# Patient Record
Sex: Female | Born: 1950 | Race: White | Hispanic: No | Marital: Married | Smoking: Former smoker
Health system: Southern US, Community
[De-identification: ages and names within clinical notes are randomized; demographics above are authoritative.]

## PROBLEM LIST (undated history)

## (undated) DIAGNOSIS — M1990 Unspecified osteoarthritis, unspecified site: Secondary | ICD-10-CM

## (undated) DIAGNOSIS — L409 Psoriasis, unspecified: Secondary | ICD-10-CM

## (undated) DIAGNOSIS — E785 Hyperlipidemia, unspecified: Secondary | ICD-10-CM

## (undated) DIAGNOSIS — Z8719 Personal history of other diseases of the digestive system: Secondary | ICD-10-CM

## (undated) DIAGNOSIS — K219 Gastro-esophageal reflux disease without esophagitis: Secondary | ICD-10-CM

## (undated) DIAGNOSIS — K200 Eosinophilic esophagitis: Secondary | ICD-10-CM

## (undated) DIAGNOSIS — K8020 Calculus of gallbladder without cholecystitis without obstruction: Secondary | ICD-10-CM

## (undated) DIAGNOSIS — I10 Essential (primary) hypertension: Secondary | ICD-10-CM

## (undated) DIAGNOSIS — E039 Hypothyroidism, unspecified: Secondary | ICD-10-CM

## (undated) DIAGNOSIS — K859 Acute pancreatitis without necrosis or infection, unspecified: Secondary | ICD-10-CM

## (undated) DIAGNOSIS — K635 Polyp of colon: Secondary | ICD-10-CM

## (undated) HISTORY — DX: Psoriasis, unspecified: L40.9

## (undated) HISTORY — DX: Hyperlipidemia, unspecified: E78.5

## (undated) HISTORY — PX: OTHER SURGICAL HISTORY: SHX169

## (undated) HISTORY — DX: Gastro-esophageal reflux disease without esophagitis: K21.9

## (undated) HISTORY — DX: Personal history of other diseases of the digestive system: Z87.19

## (undated) HISTORY — DX: Essential (primary) hypertension: I10

## (undated) HISTORY — DX: Unspecified osteoarthritis, unspecified site: M19.90

## (undated) HISTORY — DX: Calculus of gallbladder without cholecystitis without obstruction: K80.20

## (undated) HISTORY — PX: APPENDECTOMY: SHX54

## (undated) HISTORY — DX: Polyp of colon: K63.5

## (undated) HISTORY — DX: Acute pancreatitis without necrosis or infection, unspecified: K85.90

## (undated) HISTORY — PX: COLOSTOMY: SHX63

## (undated) HISTORY — PX: CHOLECYSTECTOMY: SHX55

## (undated) HISTORY — DX: Hypothyroidism, unspecified: E03.9

## (undated) HISTORY — DX: Eosinophilic esophagitis: K20.0

---

## 2019-11-27 ENCOUNTER — Other Ambulatory Visit (INDEPENDENT_AMBULATORY_CARE_PROVIDER_SITE_OTHER): Payer: Medicare Other

## 2019-11-27 ENCOUNTER — Encounter: Payer: Self-pay | Admitting: Nurse Practitioner

## 2019-11-27 ENCOUNTER — Ambulatory Visit: Payer: Medicare Other | Admitting: Nurse Practitioner

## 2019-11-27 VITALS — BP 150/80 | HR 64 | Ht 65.25 in | Wt 168.1 lb

## 2019-11-27 DIAGNOSIS — Z8719 Personal history of other diseases of the digestive system: Secondary | ICD-10-CM

## 2019-11-27 DIAGNOSIS — Z86010 Personal history of colonic polyps: Secondary | ICD-10-CM

## 2019-11-27 DIAGNOSIS — K200 Eosinophilic esophagitis: Secondary | ICD-10-CM

## 2019-11-27 LAB — BASIC METABOLIC PANEL
BUN: 14 mg/dL (ref 6–23)
CO2: 30 mEq/L (ref 19–32)
Calcium: 9.7 mg/dL (ref 8.4–10.5)
Chloride: 103 mEq/L (ref 96–112)
Creatinine, Ser: 0.76 mg/dL (ref 0.40–1.20)
GFR: 75.15 mL/min (ref >60.00)
Glucose, Bld: 95 mg/dL (ref 70–99)
Potassium: 4.4 mEq/L (ref 3.5–5.1)
Sodium: 142 mEq/L (ref 135–145)

## 2019-11-27 NOTE — Patient Instructions (Addendum)
If you are age 70 or older, your body mass index should be between 23-30. Your Body mass index is 27.75 kg/m. If this is out of the aforementioned range listed, please consider follow up with your Primary Care Provider.  If you are age 73 or younger, your body mass index should be between 19-25. Your Body mass index is 27.75 kg/m. If this is out of the aformentioned range listed, please consider follow up with your Primary Care Provider.   Your provider has requested that you go to the basement level for lab work before leaving today. Press "B" on the elevator. The lab is located at the first door on the left as you exit the elevator.  You have been scheduled for a CT scan of the abdomen and pelvis at [facility] ([address]---this is in the same building as [employer]).   You are scheduled on Monday 12/03/19 at 11 am. You should arrive 15 minutes prior to your appointment time for registration. Please follow the written instructions below on the day of your exam:  WARNING: IF YOU ARE ALLERGIC TO IODINE/X-RAY DYE, PLEASE NOTIFY RADIOLOGY IMMEDIATELY AT [phone]! YOU WILL BE GIVEN A 13 HOUR PREMEDICATION PREP.  1) Do not eat or drink anything after 7 am (4 hours prior to your test) 2) You have been given 2 bottles of oral contrast to drink. The solution may taste better if refrigerated, but do NOT add ice or any other liquid to this solution. Shake well before drinking.    Drink 1 bottle of contrast @ 9 am (2 hours prior to your exam)  Drink 1 bottle of contrast @ 10 am (1 hour prior to your exam)  You may take any medications as prescribed with a small amount of water, if necessary. If you take any of the following medications: METFORMIN, GLUCOPHAGE, GLUCOVANCE, AVANDAMET, RIOMET, FORTAMET, [place] MET, JANUMET, GLUMETZA or METAGLIP, you MAY be asked to HOLD this medication 48 hours AFTER the exam.  The purpose of you drinking the oral contrast is to aid in  the visualization of your intestinal tract. The contrast solution may cause some diarrhea. Depending on your individual set of symptoms, you may also receive an intravenous injection of x-ray contrast/dye. Plan on being at [facility] for 30 minutes or longer, depending on the type of exam you are having performed.  This test typically takes 30-45 minutes to complete.  If you have any questions regarding your exam or if you need to reschedule, you may call the CT department at [phone] between the hours of 8:00 am and 5:00 pm, Monday-Friday.  ____________________________________________________________

## 2019-11-27 NOTE — Progress Notes (Signed)
Assessment and plan reviewed with GI nurse practitioner

## 2019-11-27 NOTE — Progress Notes (Signed)
ASSESSMENT / PLAN:   69 year old female with PMH significant for hypothyroidism, hyperlipidemia, hypertension, psoriatic arthritis, colon polyps, pancreatitis, eosinophilic esophagitis, remote cholecystectomy, appendectomy  # Acute pancreatitis, resolved  -- Acute (very transient) upper abdominal pain in the setting of lipase of 979 , nearly 3 times the ULN ( ref [id number]).  AST and ALT normal ( remainder of LFTs not reported).  --Symptoms occurred prior to relocating to [place] a few weeks ago.  Limited labs available on patient's my chart record retrieved by her cell phone today. Follow up lipase a week later was normal --Etiology of pancreatitis unclear at this point.  Remote cholecystectomy 1985 (gallstones ). Potentially, HCTZ or Lisinopril are responsible. Doubt Etoh, she consumes only 1 to 2 glasses of wine per day.  She is a non-smoker.  Pancreatic neoplasm should be excluded. Will arrange for CT scan w/ contrast and fine cuts through the pancreas to be done in about two weeks.  If CT scan  negative for pancreatic lesion then consider empirically stopping or changing from HCTZ  --Bmet today in preparation for IV contrast --Follow-up with me in 1 month --Call in the interim for any recurrent pain.  Patient looks great, abdominal exam is benign.  Her weight has been stable  # History of eosinophilic esophagitis August 2019 --Treated with Flovent.  Also saw allergist and given elimination diet --Except for one episode of pill dysphagia 6 weeks ago she has been asymptomatic for months --Does not sound like she was ever tried on PPI for treatment prior to Flovent --Will request EGD/biopsy reports --Follow-up with me in 1 month  #History of colon polyps August 2019 --Records not available.  I will request records --Sounds like she was to return for 3-5 year surveillance colonoscopy .    HPI:     Chief Complaint: history of pancreatitis and eosinophilic  esophagitis   Jean Henson is a 69 year old female, new to the practice.  Patient relocated to [facility] a few weeks ago to be close to family.  Her brother-in-law is Materials engineer Dr.Karb.  Patient establishing care with [place], no current complaints.  I do not have records from previous GI  Weekly prior to moving to [facility] patient developed acute upper abdominal pain.  She immediately went to see her PCP who diagnosed her with probable pancreatitis.  Patient pulled her labs up on her my chart on mobile phone.  Her lipase was 979 with normal being (73 - 393 ).  Full liver function panel not done or at least not seen on her chart but AST was normal at 18 and ALT normal at 25.  Her white count 7, hemoglobin 12.9.  The acute pain abated within 1/2-hour or so but she had upper abdominal tenderness for the following 2 days.  Patient returned to PCP a week later for follow-up.  Her lipase was back to normal at 158, AST 14, ALT 22 (again the alk phos and total bilirubin were not reported).  No imaging was done.  Patient has not had any pain since.  She drinks 1 to 2 glasses of wine a day.  She is status post cholecystectomy 1985 for cholelithiasis . Non-smoker.  No family history of pancreatic diseases/cancers in the family.  Her weight has been stable.  Patient also has a history of eosinophilic esophagitis.  In August 2019 she had 2 episodes of near food impaction.  No therapeutic endoscopy needed as she was able  to extract/vomit the food herself.  She did ultimately have a diagnostic EGD with biopsies which apparently showed EoE.  She was not given a PPI but instead treated with Flovent for 4 months.  She also saw an allergist and went on an elimination diet at the time.  Patient had a colonoscopy done at the time of her EGD August 2019.  She apparently had polyps removed and was told a surveillance colonoscopy would be needed in 3 to 5 years.  No family history of colon cancer   Past Medical History:   Diagnosis Date  . Arthritis   . Colon polyp   . Eosinophilic esophagitis   . Gallstones   . HLD (hyperlipidemia)   . HTN (hypertension)   . Hypothyroidism   . Pancreatitis   . Psoriasis   . Status post dilation of esophageal narrowing      Past Surgical History:  Procedure Laterality Date  . APPENDECTOMY    . CESAREAN SECTION    . CHOLECYSTECTOMY    . UTERINE CYST REMOVED     Family History  Problem Relation Age of Onset  . Heart disease Mother   . Heart disease Father   . Liver disease Father   . Bipolar disorder Father   . Hypertension Sister   . Hypertension Brother   . Heart disease Maternal Grandmother   . Rheumatic fever Maternal Grandmother   . Psoriasis Maternal Grandmother   . Hypertension Brother   . Hypertension Sister    Social History   Tobacco Use  . Smoking status: Former Smoker    Types: Cigarettes    Quit date: 1982    Years since quitting: 39.4  . Smokeless tobacco: Never Used  Substance Use Topics  . Alcohol use: Yes    Comment: 1-2 per day  . Drug use: Never   Current Outpatient Medications  Medication Sig Dispense Refill  . atorvastatin (LIPITOR) 20 MG tablet Take 20 mg by mouth daily.    Marland Kitchen CALCIUM PO Take 1 tablet by mouth daily.    . clobetasol (TEMOVATE) 0.05 % external solution Apply 1 application topically 2 (two) times daily.    . hydrochlorothiazide (MICROZIDE) 12.5 MG capsule Take 12.5 mg by mouth daily.    Marland Kitchen levothyroxine (SYNTHROID) 100 MCG tablet Take 100 mcg by mouth daily before breakfast.    . lisinopril (ZESTRIL) 20 MG tablet Take 20 mg by mouth daily.    Marland Kitchen VITAMIN D PO Take 1 tablet by mouth daily.     No current facility-administered medications for this visit.   No Known Allergies   Review of Systems:  All systems reviewed and negative except where noted in HPI.   Creatinine clearance cannot be calculated (No successful lab value found.)   Physical Exam:    Wt Readings from Last 3 Encounters:  11/27/19  168 lb 1 oz (76.2 kg)    BP (!) 150/80 (BP Location: Left Arm, Patient Position: Sitting, Cuff Size: Normal)   Pulse 64   Ht 5' 5.25" (1.657 m) Comment: height measured without shoes  Wt 168 lb 1 oz (76.2 kg)   BMI 27.75 kg/m  Constitutional:  Pleasant female in no acute distress. Psychiatric: Normal mood and affect. Behavior is normal. EENT: Pupils normal.  Conjunctivae are normal. No scleral icterus. Neck supple.  Cardiovascular: Normal rate, regular rhythm. No edema Pulmonary/chest: Effort normal and breath sounds normal. No wheezing, rales or rhonchi. Abdominal: Soft, nondistended, nontender. Bowel sounds active throughout. There are no masses  palpable. No hepatomegaly. Neurological: Alert and oriented to person place and time. Skin: Skin is warm and dry. No rashes noted.  Tye Savoy, NP  11/27/2019, 9:33 AM

## 2019-12-03 ENCOUNTER — Ambulatory Visit (INDEPENDENT_AMBULATORY_CARE_PROVIDER_SITE_OTHER)
Admission: RE | Admit: 2019-12-03 | Discharge: 2019-12-03 | Disposition: A | Discharge status: Home / Self Care | Payer: Medicare Other | Source: Ambulatory Visit | Attending: Nurse Practitioner | Admitting: Nurse Practitioner

## 2019-12-03 ENCOUNTER — Other Ambulatory Visit: Payer: Self-pay

## 2019-12-03 DIAGNOSIS — Z8719 Personal history of other diseases of the digestive system: Secondary | ICD-10-CM

## 2019-12-03 MED ORDER — IOHEXOL 300 MG/ML  SOLN
100.0000 mL | Freq: Once | INTRAMUSCULAR | Status: AC | PRN
  Administered 2019-12-03: 100 mL via INTRAVENOUS

## 2019-12-10 ENCOUNTER — Other Ambulatory Visit: Payer: Medicare Other

## 2019-12-26 ENCOUNTER — Encounter: Payer: Self-pay | Admitting: Nurse Practitioner

## 2019-12-26 ENCOUNTER — Ambulatory Visit: Payer: Medicare Other | Admitting: Nurse Practitioner

## 2019-12-26 VITALS — BP 128/76 | HR 72 | Ht 66.0 in | Wt 164.5 lb

## 2019-12-26 DIAGNOSIS — K859 Acute pancreatitis without necrosis or infection, unspecified: Secondary | ICD-10-CM | POA: Diagnosis not present

## 2019-12-26 DIAGNOSIS — Z86010 Personal history of colonic polyps: Secondary | ICD-10-CM

## 2019-12-26 NOTE — Progress Notes (Addendum)
IMPRESSION and PLAN:     Jean Henson is a 69 y.o. female with a PMH signficant for, but not necessarily limited to,     # Acute pancreatitis, resolved  -- follow up from 11/27/19 visit --Etiology of pancreatitis unclear at this point.  Remote cholecystectomy 1985 (gallstones ).No big consumer of Etoh.  Potentially, HCTZ or Lisinopril were responsible. Pancreatic neoplasm not seen on CT scan --If patient gets recurrent upper abdominal pain in the future she will start clear liquid diet and call our office ASAP --She is trying to get established with a PCP in [place]. --Once established with a PCP I would asked that here she consider changing patient from HCTZ in the event it was cause of pancreatitis.    # History of eosinophilic esophagitis August 2019. Asymptomatic now --Treated with Flovent.  Also saw allergist and given elimination diet --Except for one episode of pill dysphagia 6 weeks ago she has been asymptomatic for months --Does not sound like she was ever tried on PPI for treatment prior to Flovent --Will reequest EGD/biopsy reports   #History of colon polyps August 2019 --Records still not available.  I will request records again --Sounds like she was to return for 3-5 year surveillance colonoscopy .   ADDENDUM:  Received endoscopic reports. Polyp surveillance colonoscopy 03/17/2018 at [facility]  Exam was complete, unusually difficult due to a redundant colon. Entire colon was tortuous. 1 9 mm polyp removed from the transverse colon, one 5 mm polyp removed from the proximal ascending colon, one 5 mm polyp removed from the rectum. Transverse colon polyp was a tubular adenoma without high-grade dysplasia.  Ascending colon polyp was a sessile serrated adenoma.  Rectal polyp was hyperplastic.  EGD 02/03/2018 for evaluation of dysphagia. Esophageal mucosal changes suspicious for eosinophilic esophagitis, normal stomach and normal examined  portion of the duodenum. Esophageal biopsies demonstrating patchy mild increase in intraepithelial eosinophils up to 16 eosinophils per 40 X hpf.  Finding suspicious for eosinophilic esophagitis.  Duodenal biopsy showing normal villous architecture.  * I will forward these results to Dr. Henrene Pastor, patient's primary GI.  I suspect he will want a 5-year polyp surveillance colonoscopy to be done September 2024     HPI:    Primary GI: Scarlette Shorts, MD    Chief complaint : follow up on pancreatitis.   This patient is a 69 year old female who I saw early June for evaluation of pancreatitis.  She recently moved to [facility] in the week prior developed acute abdominal pain.  Lipase at PCPs office was 979.  Etiology was unclear but symptoms quickly resolved.  Status post remote cholecystectomy.  She was noted to be consumer of alcohol.  It was noted that she was on an ACE inhibitor as well as HCTZ, both potential culprits . When I saw her in clinic I ordered a CT scan of the abdomen and pelvis to be done a few weeks later to rule out any pancreatic masses.  CT scan of the abdomen showed no pancreatic masses, lymphadenopathy, pancreatic ductal dilatation or other related finding  INTERVAL HISTORY:   Patient is back for follow-up.  She has had no further abdominal pain.  She has no GI or general medical complaints.  Needs a a PCP in [place] / GI studies: Awaiting colonoscopy   Review of systems:     No chest pain, no SOB, no fevers, no urinary sx   Past Medical  History:  Diagnosis Date  . Arthritis   . Colon polyp   . Eosinophilic esophagitis   . Gallstones   . HLD (hyperlipidemia)   . HTN (hypertension)   . Hypothyroidism   . Pancreatitis   . Psoriasis   . Status post dilation of esophageal narrowing     Patient's surgical history, family medical history, social history, medications and allergies were all reviewed in Epic   Creatinine clearance  cannot be calculated (Patient's most recent lab result is older than the maximum 21 days allowed.)  Current Outpatient Medications  Medication Sig Dispense Refill  . atorvastatin (LIPITOR) 20 MG tablet Take 20 mg by mouth daily.    Marland Kitchen CALCIUM PO Take 1 tablet by mouth daily.    . clobetasol (TEMOVATE) 0.05 % external solution Apply 1 application topically 2 (two) times daily.    . hydrochlorothiazide (MICROZIDE) 12.5 MG capsule Take 12.5 mg by mouth daily.    Marland Kitchen levothyroxine (SYNTHROID) 100 MCG tablet Take 100 mcg by mouth daily before breakfast.    . lisinopril (ZESTRIL) 20 MG tablet Take 20 mg by mouth daily.    Marland Kitchen VITAMIN D PO Take 1 tablet by mouth daily.     No current facility-administered medications for this visit.    Filed Weights   12/26/19 0859  Weight: 164 lb 8 oz (74.6 kg)    Physical Exam:     BP 128/76   Pulse 72   Ht 5\' 6"  (1.676 m)   Wt 164 lb 8 oz (74.6 kg)   BMI 26.55 kg/m   GENERAL:  Pleasant female in NAD PSYCH: : Cooperative, normal affect CARDIAC:  RRR PULM: Normal respiratory effort, lungs CTA bilaterally, no wheezing ABDOMEN:  Nondistended, soft, nontender. No obvious masses, no hepatomegaly,  normal bowel sounds SKIN:  turgor, no lesions seen Musculoskeletal:  Normal muscle tone, normal strength NEURO: Alert and oriented x 3, no focal neurologic deficits   Tye Savoy , NP 12/26/2019, 9:20 AM

## 2019-12-26 NOTE — Patient Instructions (Signed)
If you are age 70 or older, your body mass index should be between 23-30. Your Body mass index is 26.55 kg/m. If this is out of the aforementioned range listed, please consider follow up with your Primary Care Provider.  If you are age 53 or younger, your body mass index should be between 19-25. Your Body mass index is 26.55 kg/m. If this is out of the aformentioned range listed, please consider follow up with your Primary Care Provider.   You will follow up with our office on an as needed basis.  Please contact us if you need anything.    We will request colonoscopy records again for [facility] to review.  Thank you for entrusting me with your care and choosing [facility].  Colletta Maryland, NP

## 2019-12-26 NOTE — Progress Notes (Signed)
Assessment noted 

## 2020-01-01 ENCOUNTER — Telehealth: Payer: Self-pay | Admitting: Nurse Practitioner

## 2020-01-01 NOTE — Telephone Encounter (Signed)
Rec'd from [facility] forwarded 15 pages to [place]

## 2020-05-26 ENCOUNTER — Telehealth: Payer: Self-pay | Admitting: Nurse Practitioner

## 2020-05-26 NOTE — Telephone Encounter (Signed)
[facility] Dept received medical records from [facility] - forwarding to Dr. Chester Holstein at the GI office 05/26/20  KLM

## 2020-08-02 DIAGNOSIS — Z20822 Contact with and (suspected) exposure to covid-19: Secondary | ICD-10-CM | POA: Diagnosis not present

## 2020-08-02 DIAGNOSIS — Z03818 Encounter for observation for suspected exposure to other biological agents ruled out: Secondary | ICD-10-CM | POA: Diagnosis not present

## 2020-09-25 DIAGNOSIS — E039 Hypothyroidism, unspecified: Secondary | ICD-10-CM | POA: Diagnosis not present

## 2020-09-25 DIAGNOSIS — E785 Hyperlipidemia, unspecified: Secondary | ICD-10-CM | POA: Diagnosis not present

## 2020-09-25 DIAGNOSIS — I10 Essential (primary) hypertension: Secondary | ICD-10-CM | POA: Diagnosis not present

## 2020-09-25 DIAGNOSIS — A09 Infectious gastroenteritis and colitis, unspecified: Secondary | ICD-10-CM | POA: Diagnosis not present

## 2020-11-04 DIAGNOSIS — Z1389 Encounter for screening for other disorder: Secondary | ICD-10-CM | POA: Diagnosis not present

## 2020-11-04 DIAGNOSIS — I700 Atherosclerosis of aorta: Secondary | ICD-10-CM | POA: Diagnosis not present

## 2020-11-04 DIAGNOSIS — Z1211 Encounter for screening for malignant neoplasm of colon: Secondary | ICD-10-CM | POA: Diagnosis not present

## 2020-11-04 DIAGNOSIS — E039 Hypothyroidism, unspecified: Secondary | ICD-10-CM | POA: Diagnosis not present

## 2020-11-04 DIAGNOSIS — Z1231 Encounter for screening mammogram for malignant neoplasm of breast: Secondary | ICD-10-CM | POA: Diagnosis not present

## 2020-11-04 DIAGNOSIS — E785 Hyperlipidemia, unspecified: Secondary | ICD-10-CM | POA: Diagnosis not present

## 2020-11-04 DIAGNOSIS — Z0000 Encounter for general adult medical examination without abnormal findings: Secondary | ICD-10-CM | POA: Diagnosis not present

## 2020-11-04 DIAGNOSIS — I10 Essential (primary) hypertension: Secondary | ICD-10-CM | POA: Diagnosis not present

## 2020-11-04 DIAGNOSIS — Z7189 Other specified counseling: Secondary | ICD-10-CM | POA: Diagnosis not present

## 2020-11-14 DIAGNOSIS — M8589 Other specified disorders of bone density and structure, multiple sites: Secondary | ICD-10-CM | POA: Diagnosis not present

## 2020-11-14 DIAGNOSIS — Z780 Asymptomatic menopausal state: Secondary | ICD-10-CM | POA: Diagnosis not present

## 2021-03-06 DIAGNOSIS — Z1231 Encounter for screening mammogram for malignant neoplasm of breast: Secondary | ICD-10-CM | POA: Diagnosis not present

## 2021-04-21 ENCOUNTER — Encounter: Payer: Self-pay | Admitting: Internal Medicine

## 2021-04-21 ENCOUNTER — Ambulatory Visit: Payer: Medicare Other | Admitting: Internal Medicine

## 2021-04-21 VITALS — BP 158/82 | HR 59 | Ht 66.0 in | Wt 178.0 lb

## 2021-04-21 DIAGNOSIS — K200 Eosinophilic esophagitis: Secondary | ICD-10-CM

## 2021-04-21 DIAGNOSIS — R1310 Dysphagia, unspecified: Secondary | ICD-10-CM | POA: Diagnosis not present

## 2021-04-21 DIAGNOSIS — Z86010 Personal history of colonic polyps: Secondary | ICD-10-CM | POA: Diagnosis not present

## 2021-04-21 DIAGNOSIS — Z8719 Personal history of other diseases of the digestive system: Secondary | ICD-10-CM | POA: Diagnosis not present

## 2021-04-21 DIAGNOSIS — K222 Esophageal obstruction: Secondary | ICD-10-CM

## 2021-04-21 NOTE — Patient Instructions (Signed)
If you are age 71 or older, your body mass index should be between 23-30. Your Body mass index is 28.73 kg/m. If this is out of the aforementioned range listed, please consider follow up with your Primary Care Provider.  If you are age 49 or younger, your body mass index should be between 19-25. Your Body mass index is 28.73 kg/m. If this is out of the aformentioned range listed, please consider follow up with your Primary Care Provider.   ________________________________________________________  The [facility] providers would like to encourage you to use [facility] to communicate with providers for non-urgent requests or questions.  Due to long hold times on the telephone, sending your provider a message by [facility] may be a faster and more efficient way to get a response.  Please allow 48 business hours for a response.  Please remember that this is for non-urgent requests.  _______________________________________________________ Dennis Bast have been scheduled for an endoscopy. Please follow written instructions given to you at your visit today. If you use inhalers (even only as needed), please bring them with you on the day of your procedure.

## 2021-04-21 NOTE — Progress Notes (Signed)
HISTORY OF PRESENT ILLNESS:  Jean Henson is a 70 y.o. female native of [place] and sister-in-law of Dr. Marcene Duos presents today regarding intermittent solid food dysphagia and the need for upper endoscopy and/or colonoscopy.  Patient was evaluated in this office on 1 occasion to establish after a bout of pancreatitis.  The office evaluation occurred December 26, 2019.  See that dictation.  The cause of pancreatitis was uncertain.  She is status post cholecystectomy.  She has had no problems since.  She also carries a diagnosis of eosinophilic esophagitis and colon polyps.  She did undergo colonoscopy and upper endoscopy September 2019 while living in [place].  Upper endoscopy revealed endoscopic changes consistent with eosinophilic esophagitis.  Esophageal biopsies revealed up to 16 eosinophils per high-power field.  She was not placed on PPI.  She does have occasional reflux symptoms.  These have been less problematic in recent years.  She does report intermittent solid food dysphagia.  She describes transient food impaction but has not required endoscopic removal.  On her upper endoscopy in 2019 she did undergo esophageal dilation with a 17 mm Savary dilator.  One-point she may have been treated with inhaled steroids.  She did not care for this.  On colonoscopy at the same time she had less than 1 cm tubular adenoma and sessile serrated polyp.  Also hyperplastic polyp.  Examination was said to be difficult due to a redundant colon.  However, the examination was complete with the relevant landmarks identified and photographed.  She has no lower GI complaints.  No family history of colon cancer.  REVIEW OF SYSTEMS:  All non-GI ROS negative.  Past Medical History:  Diagnosis Date   Arthritis    Colon polyp    Eosinophilic esophagitis    Gallstones    HLD (hyperlipidemia)    HTN (hypertension)    Hypothyroidism    Pancreatitis    Psoriasis    Status post dilation of esophageal narrowing     Past  Surgical History:  Procedure Laterality Date   APPENDECTOMY     CESAREAN SECTION     CHOLECYSTECTOMY     UTERINE CYST REMOVED      Social History Apolonia Ellwood  reports that she quit smoking about 40 years ago. Her smoking use included cigarettes. She has never used smokeless tobacco. She reports current alcohol use. She reports that she does not use drugs.  family history includes Bipolar disorder in her father; Heart disease in her father, maternal grandmother, and mother; Hypertension in her brother, brother, sister, and sister; Liver disease in her father; Psoriasis in her maternal grandmother; Rheumatic fever in her maternal grandmother.  No Known Allergies     PHYSICAL EXAMINATION: Vital signs: BP (!) 158/82   Pulse (!) 59   Ht 5\' 6"  (1.676 m)   Wt 178 lb (80.7 kg)   BMI 28.73 kg/m   Constitutional: generally well-appearing, no acute distress Psychiatric: alert and oriented x3, cooperative Eyes: extraocular movements intact, anicteric, conjunctiva pink Mouth: Mask Neck: supple no lymphadenopathy Cardiovascular: heart regular rate and rhythm, no murmur Lungs: clear to auscultation bilaterally Abdomen: soft, nontender, nondistended, no obvious ascites, no peritoneal signs, normal bowel sounds, no organomegaly Rectal: Omitted Extremities: no clubbing, cyanosis, or lower extremity edema bilaterally Skin: no lesions on visible extremities Neuro: No focal deficits.  Cranial nerves intact  ASSESSMENT:  1.  History of esophageal eosinophilia.  Primary versus secondary. 2.  History of GERD 3.  Intermittent solid food dysphagia.  Likely  due to esophageal stricture from #1 above. 4.  History of diminutive and small adenomas, SSP.  Appropriate time for follow-up would be 5 years from the most recent examination based on current guidelines.  She has no lower GI complaints. 5.  History of redundant colon on colonoscopy 6.  History of pancreatitis.  Question etiology.  Resolved  without recurrence 7.  Status postcholecystectomy   PLAN:  1.  Schedule upper endoscopy with esophageal dilation.The nature of the procedure, as well as the risks, benefits, and alternatives were carefully and thoroughly reviewed with the patient. Ample time for discussion and questions allowed. The patient understood, was satisfied, and agreed to proceed.  2.  May need PPI. 3.  Surveillance colonoscopy around September 2024.  CMA asked to enter recall for this date. 4.  GI follow-up thereafter to be determined  A total time of 40 minutes was spent preparing to see the patient, reviewing test, procedures, and pathology.  Obtaining comprehensive history and performing medically appropriate physical examination.  Counseling the patient regarding the above listed issues, ordering advanced endoscopic/therapeutic procedures, and documenting clinical information in the health record

## 2021-04-24 ENCOUNTER — Encounter: Payer: Self-pay | Admitting: Internal Medicine

## 2021-04-24 ENCOUNTER — Other Ambulatory Visit: Payer: Self-pay

## 2021-04-24 ENCOUNTER — Ambulatory Visit (AMBULATORY_SURGERY_CENTER): Payer: Medicare Other | Admitting: Internal Medicine

## 2021-04-24 VITALS — BP 177/84 | HR 56 | Temp 96.4°F | Resp 18 | Ht 66.0 in | Wt 178.0 lb

## 2021-04-24 DIAGNOSIS — K200 Eosinophilic esophagitis: Secondary | ICD-10-CM | POA: Diagnosis not present

## 2021-04-24 DIAGNOSIS — Z8719 Personal history of other diseases of the digestive system: Secondary | ICD-10-CM | POA: Diagnosis not present

## 2021-04-24 DIAGNOSIS — K2970 Gastritis, unspecified, without bleeding: Secondary | ICD-10-CM | POA: Diagnosis not present

## 2021-04-24 DIAGNOSIS — K219 Gastro-esophageal reflux disease without esophagitis: Secondary | ICD-10-CM | POA: Diagnosis not present

## 2021-04-24 DIAGNOSIS — R1310 Dysphagia, unspecified: Secondary | ICD-10-CM

## 2021-04-24 DIAGNOSIS — K2951 Unspecified chronic gastritis with bleeding: Secondary | ICD-10-CM | POA: Diagnosis not present

## 2021-04-24 DIAGNOSIS — K222 Esophageal obstruction: Secondary | ICD-10-CM

## 2021-04-24 DIAGNOSIS — K2990 Gastroduodenitis, unspecified, without bleeding: Secondary | ICD-10-CM | POA: Diagnosis not present

## 2021-04-24 MED ORDER — PANTOPRAZOLE SODIUM 40 MG PO TBEC
40.0000 mg | DELAYED_RELEASE_TABLET | Freq: Every day | ORAL | 11 refills | Status: DC
Start: 2021-04-24 — End: 2021-06-09

## 2021-04-24 MED ORDER — SODIUM CHLORIDE 0.9 % IV SOLN: 500.0000 mL | Freq: Once | INTRAVENOUS | Status: DC

## 2021-04-24 NOTE — Progress Notes (Signed)
Pt's states no medical or surgical changes since previsit or office visit. 

## 2021-04-24 NOTE — Op Note (Signed)
[facility] Patient Name: Jean Henson Procedure Date: 04/24/2021 12:45 PM MRN: [id number] Endoscopist: Docia Chuck. Henrene Pastor , MD Age: 70 Referring MD:  Date of Birth: [birth year] Gender: Female Account #: [id number] Procedure:                Upper GI endoscopy with biopsy; Maloney dilation of                            the esophagus?"37 French Indications:              Dysphagia Medicines:                Monitored Anesthesia Care Procedure:                Pre-Anesthesia Assessment:                           - Prior to the procedure, a History and Physical                            was performed, and patient medications and                            allergies were reviewed. The patient's tolerance of                            previous anesthesia was also reviewed. The risks                            and benefits of the procedure and the sedation                            options and risks were discussed with the patient.                            All questions were answered, and informed consent                            was obtained. Prior Anticoagulants: The patient has                            taken no previous anticoagulant or antiplatelet                            agents. ASA Grade Assessment: II - A patient with                            mild systemic disease. After reviewing the risks                            and benefits, the patient was deemed in                            satisfactory condition to undergo the procedure.  After obtaining informed consent, the endoscope was                            passed under direct vision. Throughout the                            procedure, the patient's blood pressure, pulse, and                            oxygen saturations were monitored continuously. The                            Endoscope was introduced through the mouth, and                            advanced to the second part of  duodenum. The upper                            GI endoscopy was accomplished without difficulty.                            The patient tolerated the procedure well. Scope In: Scope Out: Findings:                 The esophagus revealed erosive esophagitis with                            ulceration in the region of the gastroesophageal                            junction. There was associated stricturing. No                            Barrett's.                           One benign-appearing, intrinsic moderate stenosis                            was found 34 cm from the incisors. This stenosis                            measured 1.4 cm (inner diameter). After completing                            the endoscopic survey, the scope was withdrawn.                            Dilation was performed with a Maloney dilator with                            no resistance at 92 Fr.                           The stomach revealed a sliding hiatal  hernia. In                            addition scattered inflammation and hematin without                            ulceration. Biopsies were taken with a cold forceps                            for histology.                           The examined duodenum revealed scattered erythema                            in the second portion.                           The cardia and gastric fundus were normal on                            retroflexion. Complications:            No immediate complications. Estimated Blood Loss:     Estimated blood loss: none. Impression:               1. 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 Mild nonspecific gastroduodenitis                           3. Otherwise normal exam. Recommendation:           1. Patient has a contact number available for                            emergencies. The signs and symptoms of potential                            delayed  complications were discussed with the                            patient. Return to normal activities tomorrow.                            Written discharge instructions were provided to the                            patient.                           2. Post dilation diet.                           3. Continue  present medications.                           4. Await pathology results.                           5. Prescribe pantoprazole 40 mg daily; #30; 11                            refills. Please take this medicine daily (in the                            morning 30 to 60 minutes before breakfast). This                            will help heal the ulceration of your esophagus,                            prevent recurrent ulceration of the esophagus, and                            reduce the risk of recurrent stricture formation                           6. Office follow-up with Dr. Henrene Pastor in 6 to 8 weeks Docia Chuck. Henrene Pastor, MD 04/24/2021 1:02:34 PM This report has been signed electronically.

## 2021-04-24 NOTE — Progress Notes (Signed)
A/ox3, pleased with MAC, report to RN 

## 2021-04-24 NOTE — Progress Notes (Signed)
Called to room to assist during endoscopic procedure.  Patient ID and intended procedure confirmed with present staff. Received instructions for my participation in the procedure from the performing physician.  

## 2021-04-24 NOTE — Progress Notes (Signed)
See PREPROCEDURE H&P  Patient was seen in the office April 21, 2021 regarding dysphagia.  See that dictation.  No interval changes.  Now for upper endoscopy with esophageal dilation.

## 2021-04-24 NOTE — Patient Instructions (Signed)
Please read handouts provided. Continue present medications. Begin pantoprazole 40 mg,  take this medicine daily in the morning 30 to 60 minutes before breakfast. Office follow-up with Dr. Henrene Pastor in 6 to 8 weeks. Await pathology results. Post Dilation Diet.   YOU HAD AN ENDOSCOPIC PROCEDURE TODAY AT [facility]:   Refer to the procedure report that was given to you for any specific questions about what was found during the examination.  If the procedure report does not answer your questions, please call your gastroenterologist to clarify.  If you requested that your care partner not be given the details of your procedure findings, then the procedure report has been included in a sealed envelope for you to review at your convenience later.  YOU SHOULD EXPECT: Some feelings of bloating in the abdomen. Passage of more gas than usual.  Walking can help get rid of the air that was put into your GI tract during the procedure and reduce the bloating. If you had a lower endoscopy (such as a colonoscopy or flexible sigmoidoscopy) you may notice spotting of blood in your stool or on the toilet paper. If you underwent a bowel prep for your procedure, you may not have a normal bowel movement for a few days.  Please Note:  You might notice some irritation and congestion in your nose or some drainage.  This is from the oxygen used during your procedure.  There is no need for concern and it should clear up in a day or so.  SYMPTOMS TO REPORT IMMEDIATELY:    Following upper endoscopy (EGD)  Vomiting of blood or coffee ground material  New chest pain or pain under the shoulder blades  Painful or persistently difficult swallowing  New shortness of breath  Fever of 100F or higher  Black, tarry-looking stools  For urgent or emergent issues, a gastroenterologist can be reached at any hour by calling [phone]. Do not use MyChart messaging for urgent concerns.    DIET:  Drink plenty of  fluids but you should avoid alcoholic beverages for 24 hours.  ACTIVITY:  You should plan to take it easy for the rest of today and you should NOT DRIVE or use heavy machinery until tomorrow (because of the sedation medicines used during the test).    FOLLOW UP: Our staff will call the number listed on your records 48-72 hours following your procedure to check on you and address any questions or concerns that you may have regarding the information given to you following your procedure. If we do not reach you, we will leave a message.  We will attempt to reach you two times.  During this call, we will ask if you have developed any symptoms of COVID 19. If you develop any symptoms (ie: fever, flu-like symptoms, shortness of breath, cough etc.) before then, please call [phone].  If you test positive for Covid 19 in the 2 weeks post procedure, please call and report this information to [place].    If any biopsies were taken you will be contacted by phone or by letter within the next 1-3 weeks.  Please call us at [phone] if you have not heard about the biopsies in 3 weeks.    SIGNATURES/CONFIDENTIALITY: You and/or your care partner have signed paperwork which will be entered into your electronic medical record.  These signatures attest to the fact that that the information above on your After Visit Summary has been reviewed and is understood.  Full responsibility of  the confidentiality of this discharge information lies with you and/or your care-partner.

## 2021-04-28 ENCOUNTER — Telehealth: Payer: Self-pay | Admitting: *Deleted

## 2021-04-28 NOTE — Telephone Encounter (Signed)
  Follow up Call-  Call back number 04/24/2021  Post procedure Call Back phone  # [phone]  Permission to leave phone message Yes  Some recent data might be hidden     Patient questions:  Do you have a fever, pain , or abdominal swelling? No. Pain Score  0 *  Have you tolerated food without any problems? Yes.    Have you been able to return to your normal activities? Yes.    Do you have any questions about your discharge instructions: Diet   No. Medications  No. Follow up visit  No.  Do you have questions or concerns about your Care? No.  Actions: * If pain score is 4 or above: No action needed, pain <4.  Have you developed a fever since your procedure? no  2.   Have you had an respiratory symptoms (SOB or cough) since your procedure? no  3.   Have you tested positive for COVID 19 since your procedure no  4.   Have you had any family members/close contacts diagnosed with the COVID 19 since your procedure?  no   If yes to any of these questions please route to Joylene John, RN and Joella Prince, RN

## 2021-05-01 ENCOUNTER — Encounter: Payer: Self-pay | Admitting: Internal Medicine

## 2021-05-05 DIAGNOSIS — E039 Hypothyroidism, unspecified: Secondary | ICD-10-CM | POA: Diagnosis not present

## 2021-05-05 DIAGNOSIS — E785 Hyperlipidemia, unspecified: Secondary | ICD-10-CM | POA: Diagnosis not present

## 2021-05-05 DIAGNOSIS — I10 Essential (primary) hypertension: Secondary | ICD-10-CM | POA: Diagnosis not present

## 2021-05-12 DIAGNOSIS — L820 Inflamed seborrheic keratosis: Secondary | ICD-10-CM | POA: Diagnosis not present

## 2021-05-12 DIAGNOSIS — D1801 Hemangioma of skin and subcutaneous tissue: Secondary | ICD-10-CM | POA: Diagnosis not present

## 2021-05-12 DIAGNOSIS — D692 Other nonthrombocytopenic purpura: Secondary | ICD-10-CM | POA: Diagnosis not present

## 2021-05-12 DIAGNOSIS — L0889 Other specified local infections of the skin and subcutaneous tissue: Secondary | ICD-10-CM | POA: Diagnosis not present

## 2021-05-12 DIAGNOSIS — L661 Lichen planopilaris: Secondary | ICD-10-CM | POA: Diagnosis not present

## 2021-05-12 DIAGNOSIS — L400 Psoriasis vulgaris: Secondary | ICD-10-CM | POA: Diagnosis not present

## 2021-05-12 DIAGNOSIS — L821 Other seborrheic keratosis: Secondary | ICD-10-CM | POA: Diagnosis not present

## 2021-05-12 DIAGNOSIS — D225 Melanocytic nevi of trunk: Secondary | ICD-10-CM | POA: Diagnosis not present

## 2021-05-25 DIAGNOSIS — L400 Psoriasis vulgaris: Secondary | ICD-10-CM | POA: Diagnosis not present

## 2021-05-27 DIAGNOSIS — L400 Psoriasis vulgaris: Secondary | ICD-10-CM | POA: Diagnosis not present

## 2021-05-29 DIAGNOSIS — L400 Psoriasis vulgaris: Secondary | ICD-10-CM | POA: Diagnosis not present

## 2021-06-01 DIAGNOSIS — L400 Psoriasis vulgaris: Secondary | ICD-10-CM | POA: Diagnosis not present

## 2021-06-03 DIAGNOSIS — L400 Psoriasis vulgaris: Secondary | ICD-10-CM | POA: Diagnosis not present

## 2021-06-05 DIAGNOSIS — L400 Psoriasis vulgaris: Secondary | ICD-10-CM | POA: Diagnosis not present

## 2021-06-08 DIAGNOSIS — L400 Psoriasis vulgaris: Secondary | ICD-10-CM | POA: Diagnosis not present

## 2021-06-09 ENCOUNTER — Encounter: Payer: Self-pay | Admitting: Internal Medicine

## 2021-06-09 ENCOUNTER — Ambulatory Visit: Payer: Medicare Other | Admitting: Internal Medicine

## 2021-06-09 VITALS — BP 120/80 | HR 56 | Ht 65.0 in | Wt 178.1 lb

## 2021-06-09 DIAGNOSIS — Z8719 Personal history of other diseases of the digestive system: Secondary | ICD-10-CM

## 2021-06-09 DIAGNOSIS — K219 Gastro-esophageal reflux disease without esophagitis: Secondary | ICD-10-CM

## 2021-06-09 DIAGNOSIS — K222 Esophageal obstruction: Secondary | ICD-10-CM

## 2021-06-09 DIAGNOSIS — Z86010 Personal history of colonic polyps: Secondary | ICD-10-CM | POA: Diagnosis not present

## 2021-06-09 MED ORDER — PANTOPRAZOLE SODIUM 40 MG PO TBEC: 40.0000 mg | DELAYED_RELEASE_TABLET | Freq: Every day | ORAL | 3 refills | Status: DC

## 2021-06-09 NOTE — Patient Instructions (Signed)
If you are age 71 or older, your body mass index should be between 23-30. Your Body mass index is 29.64 kg/m. If this is out of the aforementioned range listed, please consider follow up with your Primary Care Provider.  If you are age 45 or younger, your body mass index should be between 19-25. Your Body mass index is 29.64 kg/m. If this is out of the aformentioned range listed, please consider follow up with your Primary Care Provider.   ________________________________________________________  The [facility] providers would like to encourage you to use [facility] to communicate with providers for non-urgent requests or questions.  Due to long hold times on the telephone, sending your provider a message by [facility] may be a faster and more efficient way to get a response.  Please allow 48 business hours for a response.  Please remember that this is for non-urgent requests.  _______________________________________________________  We have sent the following medications to your pharmacy for you to pick up at your convenience:  Pantoprazole  Please follow up in one year

## 2021-06-09 NOTE — Progress Notes (Signed)
HISTORY OF PRESENT ILLNESS:  Jean Henson is a 70 y.o. female, native of [place] and sister-in-law of Dr. Marcene Duos, who was evaluated in the office April 21, 2021 regarding a history of esophageal eosinophilia, GERD, intermittent solid food dysphagia, and the proper timing of surveillance colonoscopy.  She subsequently underwent upper endoscopy April 24, 2021.  She was found to have erosive esophagitis as well as a peptic stricture.  The esophagus was dilated with 54 [place] Maloney dilator.  She also had mild gastroduodenitis with unremarkable gastric biopsies.  No Helicobacter pylori.  She presents today for follow-up.  The patient is pleased to report that she has had no further issues with dysphagia.  No reflux symptoms.  She does feel that pantoprazole has caused constipation.  For this, she is taking stool softeners which worked nicely.  No new complaints  REVIEW OF SYSTEMS:  All non-GI ROS negative.  Past Medical History:  Diagnosis Date   Arthritis    Colon polyp    Eosinophilic esophagitis    Gallstones    HLD (hyperlipidemia)    HTN (hypertension)    Hypothyroidism    Pancreatitis    Psoriasis    Status post dilation of esophageal narrowing     Past Surgical History:  Procedure Laterality Date   APPENDECTOMY     CESAREAN SECTION     CHOLECYSTECTOMY     UTERINE CYST REMOVED      Social History Ziza Hastings  reports that she quit smoking about 40 years ago. Her smoking use included cigarettes. She has never used smokeless tobacco. She reports current alcohol use. She reports that she does not use drugs.  family history includes Bipolar disorder in her father; Heart disease in her father, maternal grandmother, and mother; Hypertension in her brother, brother, sister, and sister; Liver disease in her father; Psoriasis in her maternal grandmother; Rheumatic fever in her maternal grandmother.  No Known Allergies     PHYSICAL EXAMINATION: Vital signs: BP 120/80     Pulse (!) 56    Ht 5\' 5"  (1.651 m)    Wt 178 lb 2 oz (80.8 kg)    BMI 29.64 kg/m   Constitutional: generally well-appearing, no acute distress Psychiatric: alert and oriented x3, cooperative Eyes: Anicteric Mouth: Mask Abdomen: Not reexamined Skin: no lesions on visible extremities Neuro: No gross deficits  ASSESSMENT:  1.  GERD complicated by esophagitis and peptic stricture.  Asymptomatic post dilation on PPI 2.  History of diminutive and small adenomas, SSP.  Surveillance up-to-date 3.  History of redundant colon on colonoscopy 4.  History of pancreatitis.  Etiology unclear.  Resolved without recurrence 5.  Status post cholecystectomy   PLAN:  1.  Continue pantoprazole 40 mg daily 2.  Prescription refilled for 1 year.  Medication risks reviewed 3.  Reflux precautions 4.  Contact the office in the interim for questions or problems. 5.  Surveillance colonoscopy around September 2024 6.  Routine office follow-up 1 year

## 2021-06-10 DIAGNOSIS — L400 Psoriasis vulgaris: Secondary | ICD-10-CM | POA: Diagnosis not present

## 2021-06-12 DIAGNOSIS — L400 Psoriasis vulgaris: Secondary | ICD-10-CM | POA: Diagnosis not present

## 2021-06-15 DIAGNOSIS — L400 Psoriasis vulgaris: Secondary | ICD-10-CM | POA: Diagnosis not present

## 2021-06-17 DIAGNOSIS — L400 Psoriasis vulgaris: Secondary | ICD-10-CM | POA: Diagnosis not present

## 2021-06-26 DIAGNOSIS — L400 Psoriasis vulgaris: Secondary | ICD-10-CM | POA: Diagnosis not present

## 2021-06-30 DIAGNOSIS — L400 Psoriasis vulgaris: Secondary | ICD-10-CM | POA: Diagnosis not present

## 2021-07-03 DIAGNOSIS — L400 Psoriasis vulgaris: Secondary | ICD-10-CM | POA: Diagnosis not present

## 2021-07-07 DIAGNOSIS — L400 Psoriasis vulgaris: Secondary | ICD-10-CM | POA: Diagnosis not present

## 2021-07-10 DIAGNOSIS — L400 Psoriasis vulgaris: Secondary | ICD-10-CM | POA: Diagnosis not present

## 2021-07-14 DIAGNOSIS — L400 Psoriasis vulgaris: Secondary | ICD-10-CM | POA: Diagnosis not present

## 2021-07-17 DIAGNOSIS — L400 Psoriasis vulgaris: Secondary | ICD-10-CM | POA: Diagnosis not present

## 2021-07-21 DIAGNOSIS — L400 Psoriasis vulgaris: Secondary | ICD-10-CM | POA: Diagnosis not present

## 2021-07-24 DIAGNOSIS — L400 Psoriasis vulgaris: Secondary | ICD-10-CM | POA: Diagnosis not present

## 2021-07-28 DIAGNOSIS — L400 Psoriasis vulgaris: Secondary | ICD-10-CM | POA: Diagnosis not present

## 2021-07-31 DIAGNOSIS — L400 Psoriasis vulgaris: Secondary | ICD-10-CM | POA: Diagnosis not present

## 2021-09-09 IMAGING — CT CT ABDOMEN WO/W CM
4 of 11 series · 12 of 46 positions shown, 16 images · IV contrast (OMNIPAQUE 300)
Comparison: None.
COMPARISON: None.

Addendum:
CLINICAL DATA: History of pancreatitis, rule out pancreas mass

EXAM:
CT ABDOMEN WITHOUT AND WITH CONTRAST
TECHNIQUE: Multidetector CT imaging of the abdomen was performed following the
standard protocol before and following the bolus administration of
intravenous contrast.
CONTRAST:  100mL OMNIPAQUE IOHEXOL 300 MG/ML  SOLN

[Series 4: arterial phase 3.0 br38 · axial · arterial · 0.72mm/px · z∈[+1212,+1383]mm · 5 of 87 slices shown]
[im 15/87  soft-tissue]
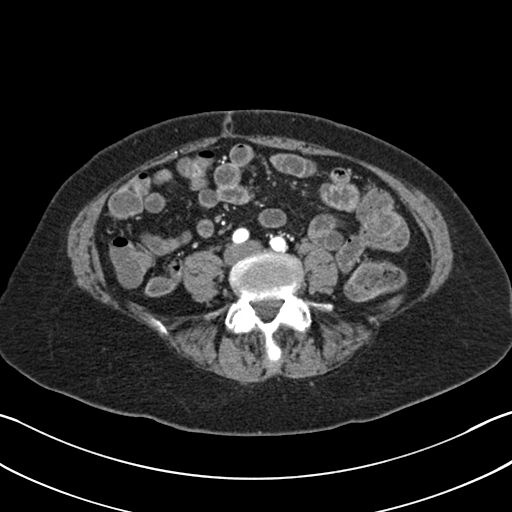
[im 29/87  soft-tissue]
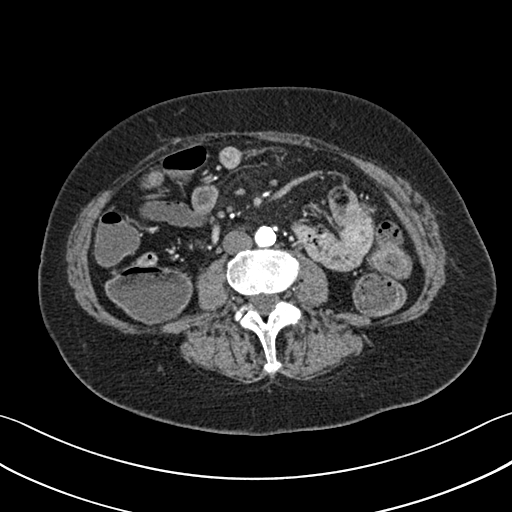
[im 44/87  soft-tissue]
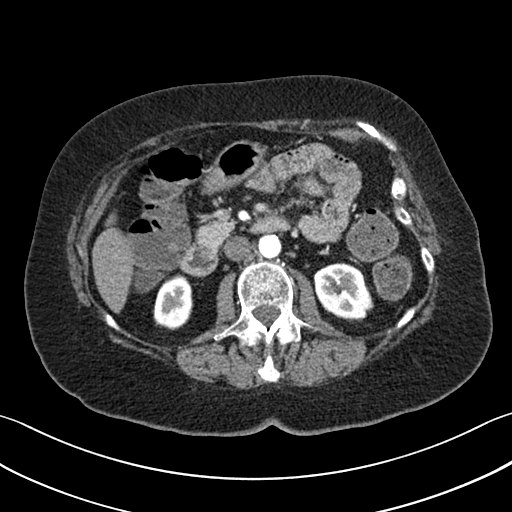
[im 58/87  soft-tissue]
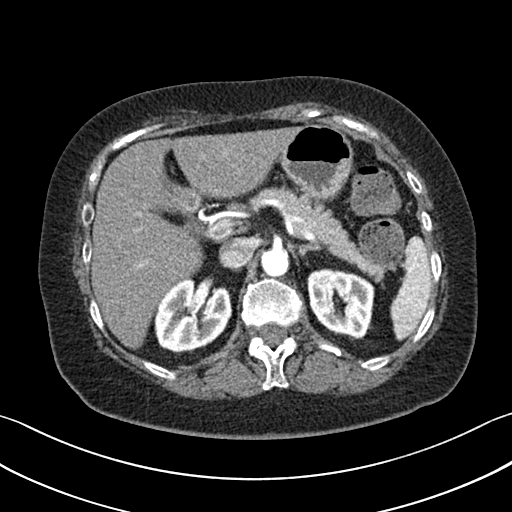
[im 72/87  soft-tissue]
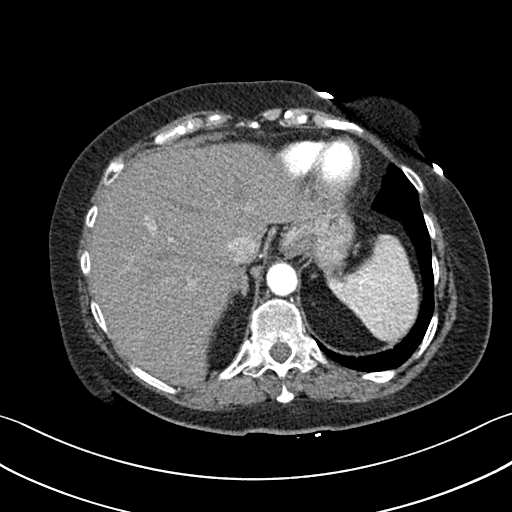

[Series 6: coronal arterial · coronal · arterial · 0.51mm/px · 3 of 84 slices shown, 4 images]
[im 21/84  soft-tissue]
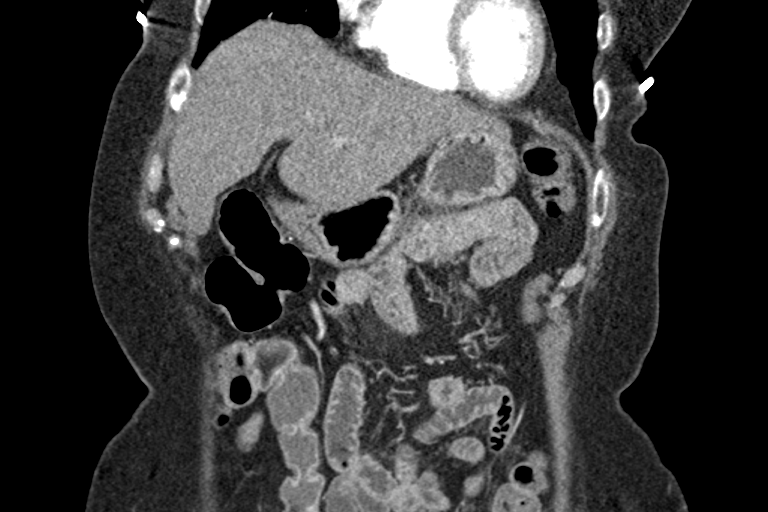
[im 42/84  soft-tissue]
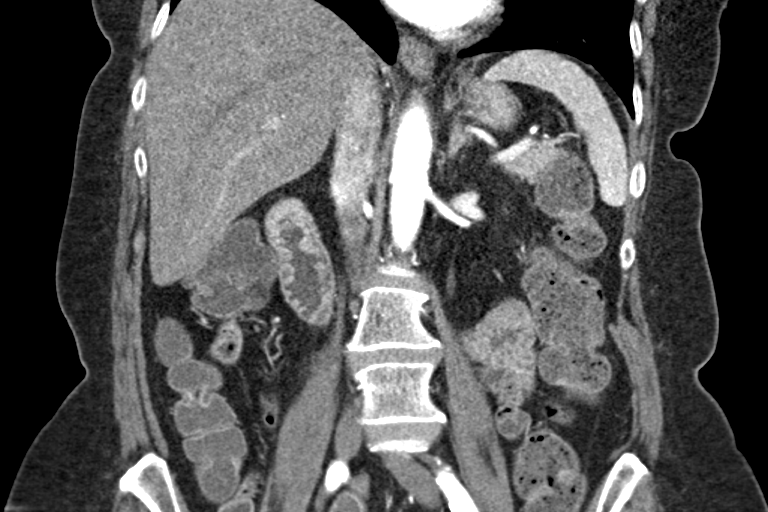
[im 42/84  bone]
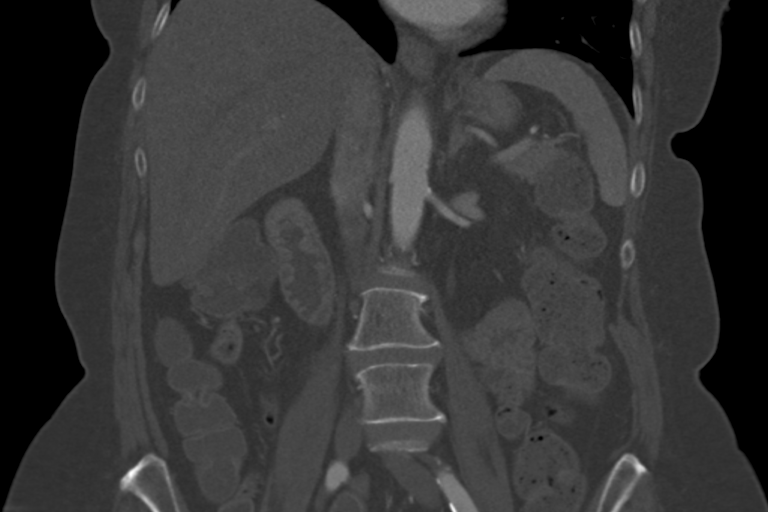
[im 63/84  soft-tissue]
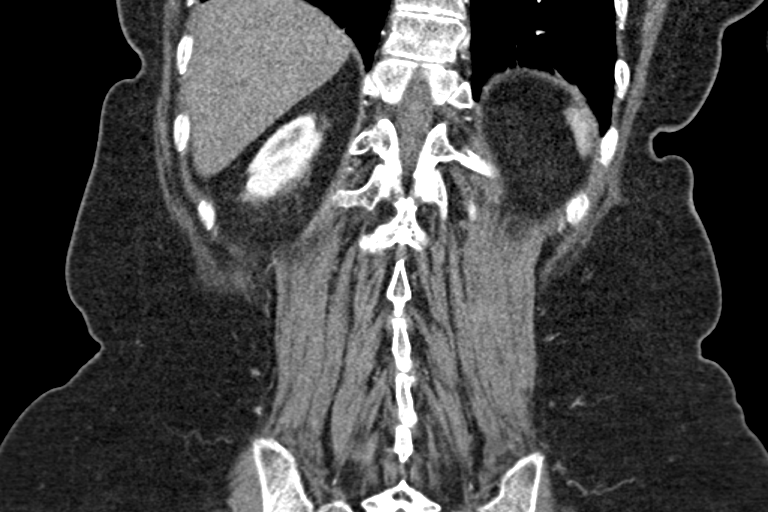

[Series 9: venous phase 5.0 br38 · axial · portal-venous · 0.72mm/px · z∈[+1273,+1348]mm · 2 of 47 slices shown, 5 images]
[im 16/47  soft-tissue]
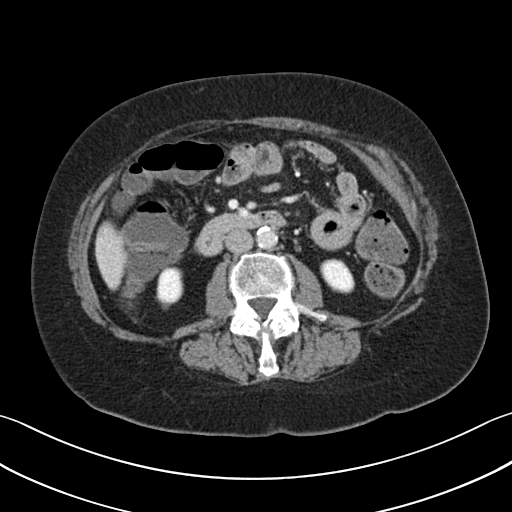
[im 16/47  lung]
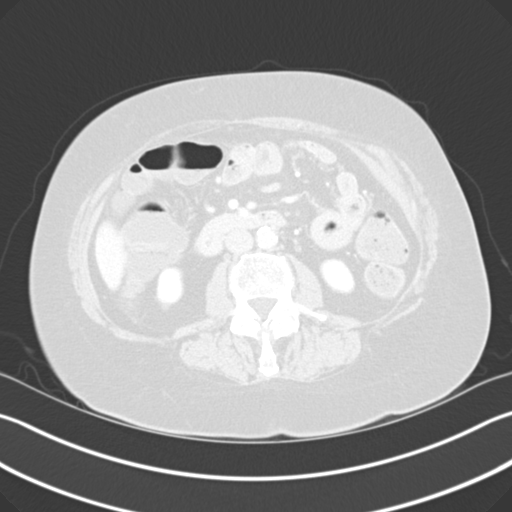
[im 16/47  bone]
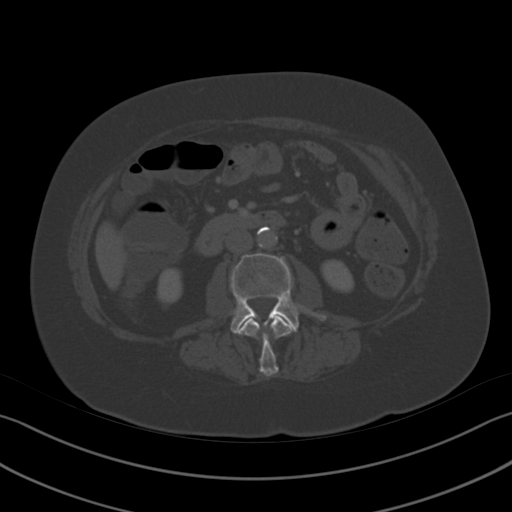
[im 31/47  soft-tissue]
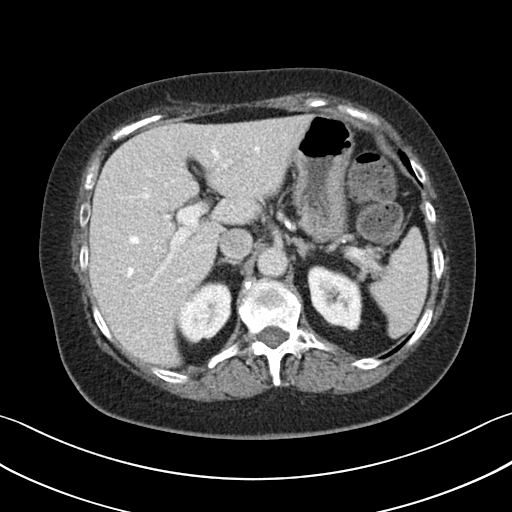
[im 31/47  lung]
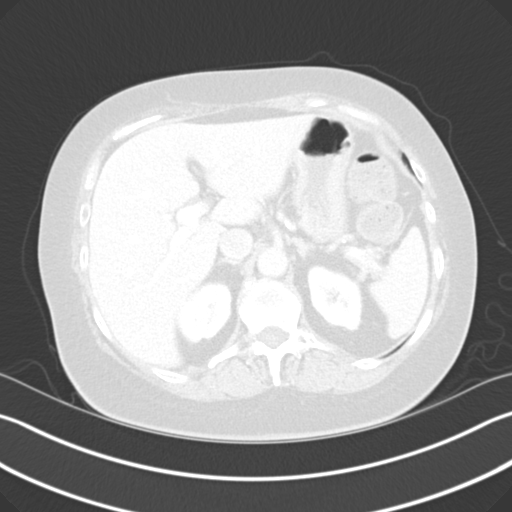

[Series 10: lungs · axial · 0.72mm/px · z∈[+1273,+1348]mm · 2 of 47 slices shown]
[im 16/47  soft-tissue]
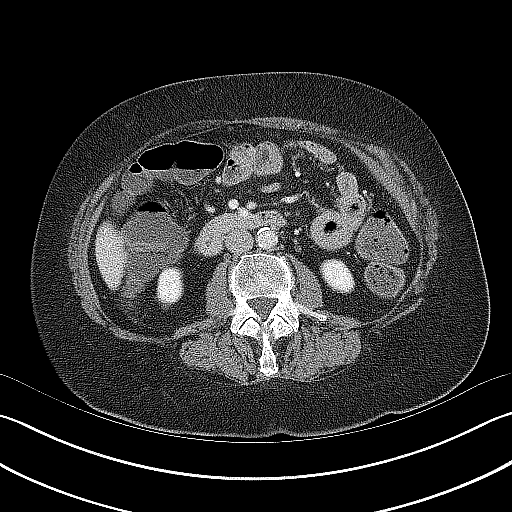
[im 31/47  soft-tissue]
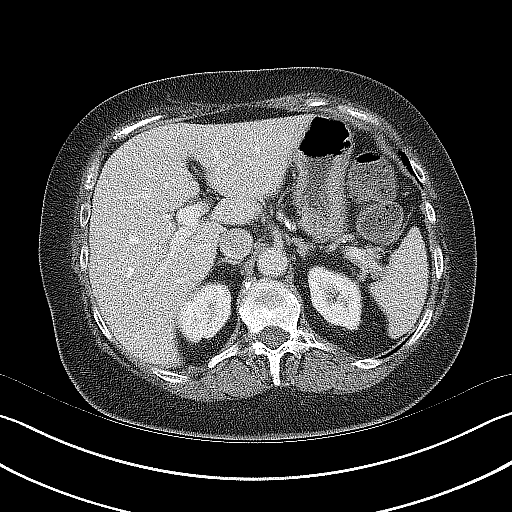

[12 of 46 positions shown; findings below may reference images not displayed]

FINDINGS: Lower chest: No acute abnormality.

Hepatobiliary: No focal liver abnormality is seen. No gallstones,
gallbladder wall thickening, or biliary dilatation.

Pancreas: Unremarkable. No pancreatic ductal dilatation or
surrounding inflammatory changes.

Spleen: Normal in size without focal abnormality.

Adrenals/Urinary Tract: Adrenal glands are unremarkable. Kidneys are
normal, without renal calculi, focal lesion, or hydronephrosis.

Stomach/Bowel: Stomach is within normal limits. Appendix appears
normal. No evidence of bowel wall thickening, distention, or
inflammatory changes.

Vascular/Lymphatic: Aortic atherosclerosis. No enlarged abdominal
lymph nodes.

Other: No abdominal wall hernia or abnormality.

Musculoskeletal: No acute or significant osseous findings.
IMPRESSION: 1. No CT evidence of pancreatic mass or secondary findings such as
lymphadenopathy, pancreatic ductal dilatation, or abdominal
metastatic disease.
2. Aortic Atherosclerosis (X7B3D-AZV.V).

ADDENDUM:
The gallbladder is surgically absent. The appendix is surgically
absent.

*** End of Addendum ***
FINDINGS: Lower chest: No acute abnormality.

Hepatobiliary: No focal liver abnormality is seen. No gallstones,
gallbladder wall thickening, or biliary dilatation.

Pancreas: Unremarkable. No pancreatic ductal dilatation or
surrounding inflammatory changes.

Spleen: Normal in size without focal abnormality.

Adrenals/Urinary Tract: Adrenal glands are unremarkable. Kidneys are
normal, without renal calculi, focal lesion, or hydronephrosis.

Stomach/Bowel: Stomach is within normal limits. Appendix appears
normal. No evidence of bowel wall thickening, distention, or
inflammatory changes.

Vascular/Lymphatic: Aortic atherosclerosis. No enlarged abdominal
lymph nodes.

Other: No abdominal wall hernia or abnormality.

Musculoskeletal: No acute or significant osseous findings.
IMPRESSION: 1. No CT evidence of pancreatic mass or secondary findings such as
lymphadenopathy, pancreatic ductal dilatation, or abdominal
metastatic disease.
2. Aortic Atherosclerosis (X7B3D-AZV.V).

## 2021-10-26 DIAGNOSIS — L438 Other lichen planus: Secondary | ICD-10-CM | POA: Diagnosis not present

## 2021-10-26 DIAGNOSIS — L570 Actinic keratosis: Secondary | ICD-10-CM | POA: Diagnosis not present

## 2021-10-26 DIAGNOSIS — D2262 Melanocytic nevi of left upper limb, including shoulder: Secondary | ICD-10-CM | POA: Diagnosis not present

## 2021-10-26 DIAGNOSIS — L661 Lichen planopilaris: Secondary | ICD-10-CM | POA: Diagnosis not present

## 2021-10-26 DIAGNOSIS — L400 Psoriasis vulgaris: Secondary | ICD-10-CM | POA: Diagnosis not present

## 2021-10-26 DIAGNOSIS — D2261 Melanocytic nevi of right upper limb, including shoulder: Secondary | ICD-10-CM | POA: Diagnosis not present

## 2021-10-26 DIAGNOSIS — L821 Other seborrheic keratosis: Secondary | ICD-10-CM | POA: Diagnosis not present

## 2021-10-26 DIAGNOSIS — D1801 Hemangioma of skin and subcutaneous tissue: Secondary | ICD-10-CM | POA: Diagnosis not present

## 2021-10-26 DIAGNOSIS — D225 Melanocytic nevi of trunk: Secondary | ICD-10-CM | POA: Diagnosis not present

## 2021-11-02 DIAGNOSIS — H31091 Other chorioretinal scars, right eye: Secondary | ICD-10-CM | POA: Diagnosis not present

## 2021-11-02 DIAGNOSIS — H43813 Vitreous degeneration, bilateral: Secondary | ICD-10-CM | POA: Diagnosis not present

## 2021-11-02 DIAGNOSIS — H2513 Age-related nuclear cataract, bilateral: Secondary | ICD-10-CM | POA: Diagnosis not present

## 2021-11-02 DIAGNOSIS — H35373 Puckering of macula, bilateral: Secondary | ICD-10-CM | POA: Diagnosis not present

## 2021-12-14 DIAGNOSIS — L400 Psoriasis vulgaris: Secondary | ICD-10-CM | POA: Diagnosis not present

## 2021-12-18 DIAGNOSIS — L400 Psoriasis vulgaris: Secondary | ICD-10-CM | POA: Diagnosis not present

## 2021-12-22 DIAGNOSIS — L400 Psoriasis vulgaris: Secondary | ICD-10-CM | POA: Diagnosis not present

## 2021-12-25 DIAGNOSIS — L400 Psoriasis vulgaris: Secondary | ICD-10-CM | POA: Diagnosis not present

## 2021-12-31 DIAGNOSIS — L400 Psoriasis vulgaris: Secondary | ICD-10-CM | POA: Diagnosis not present

## 2022-01-04 DIAGNOSIS — L409 Psoriasis, unspecified: Secondary | ICD-10-CM | POA: Diagnosis not present

## 2022-01-04 DIAGNOSIS — I10 Essential (primary) hypertension: Secondary | ICD-10-CM | POA: Diagnosis not present

## 2022-01-04 DIAGNOSIS — Z0000 Encounter for general adult medical examination without abnormal findings: Secondary | ICD-10-CM | POA: Diagnosis not present

## 2022-01-04 DIAGNOSIS — E785 Hyperlipidemia, unspecified: Secondary | ICD-10-CM | POA: Diagnosis not present

## 2022-01-04 DIAGNOSIS — K2100 Gastro-esophageal reflux disease with esophagitis, without bleeding: Secondary | ICD-10-CM | POA: Diagnosis not present

## 2022-01-04 DIAGNOSIS — E039 Hypothyroidism, unspecified: Secondary | ICD-10-CM | POA: Diagnosis not present

## 2022-01-06 ENCOUNTER — Other Ambulatory Visit: Payer: Self-pay | Admitting: Internal Medicine

## 2022-01-06 DIAGNOSIS — L400 Psoriasis vulgaris: Secondary | ICD-10-CM | POA: Diagnosis not present

## 2022-04-12 DIAGNOSIS — Z1231 Encounter for screening mammogram for malignant neoplasm of breast: Secondary | ICD-10-CM | POA: Diagnosis not present

## 2022-07-06 DIAGNOSIS — E785 Hyperlipidemia, unspecified: Secondary | ICD-10-CM | POA: Diagnosis not present

## 2022-07-06 DIAGNOSIS — R3915 Urgency of urination: Secondary | ICD-10-CM | POA: Diagnosis not present

## 2022-07-06 DIAGNOSIS — I10 Essential (primary) hypertension: Secondary | ICD-10-CM | POA: Diagnosis not present

## 2022-07-06 DIAGNOSIS — K2100 Gastro-esophageal reflux disease with esophagitis, without bleeding: Secondary | ICD-10-CM | POA: Diagnosis not present

## 2022-07-06 DIAGNOSIS — N289 Disorder of kidney and ureter, unspecified: Secondary | ICD-10-CM | POA: Diagnosis not present

## 2022-07-13 DIAGNOSIS — I10 Essential (primary) hypertension: Secondary | ICD-10-CM | POA: Diagnosis not present

## 2022-09-03 DIAGNOSIS — I10 Essential (primary) hypertension: Secondary | ICD-10-CM | POA: Diagnosis not present

## 2022-10-12 ENCOUNTER — Other Ambulatory Visit: Payer: Self-pay | Admitting: Internal Medicine

## 2022-11-08 DIAGNOSIS — H35373 Puckering of macula, bilateral: Secondary | ICD-10-CM | POA: Diagnosis not present

## 2022-11-08 DIAGNOSIS — H2513 Age-related nuclear cataract, bilateral: Secondary | ICD-10-CM | POA: Diagnosis not present

## 2022-11-08 DIAGNOSIS — H31091 Other chorioretinal scars, right eye: Secondary | ICD-10-CM | POA: Diagnosis not present

## 2022-11-08 DIAGNOSIS — H43813 Vitreous degeneration, bilateral: Secondary | ICD-10-CM | POA: Diagnosis not present

## 2022-12-14 DIAGNOSIS — L661 Lichen planopilaris: Secondary | ICD-10-CM | POA: Diagnosis not present

## 2022-12-14 DIAGNOSIS — D2261 Melanocytic nevi of right upper limb, including shoulder: Secondary | ICD-10-CM | POA: Diagnosis not present

## 2022-12-14 DIAGNOSIS — L430 Hypertrophic lichen planus: Secondary | ICD-10-CM | POA: Diagnosis not present

## 2022-12-14 DIAGNOSIS — L400 Psoriasis vulgaris: Secondary | ICD-10-CM | POA: Diagnosis not present

## 2022-12-14 DIAGNOSIS — D1801 Hemangioma of skin and subcutaneous tissue: Secondary | ICD-10-CM | POA: Diagnosis not present

## 2022-12-14 DIAGNOSIS — L820 Inflamed seborrheic keratosis: Secondary | ICD-10-CM | POA: Diagnosis not present

## 2022-12-14 DIAGNOSIS — L821 Other seborrheic keratosis: Secondary | ICD-10-CM | POA: Diagnosis not present

## 2022-12-14 DIAGNOSIS — D485 Neoplasm of uncertain behavior of skin: Secondary | ICD-10-CM | POA: Diagnosis not present

## 2022-12-14 DIAGNOSIS — D225 Melanocytic nevi of trunk: Secondary | ICD-10-CM | POA: Diagnosis not present

## 2022-12-14 DIAGNOSIS — L304 Erythema intertrigo: Secondary | ICD-10-CM | POA: Diagnosis not present

## 2023-01-06 ENCOUNTER — Encounter: Payer: Self-pay | Admitting: Internal Medicine

## 2023-01-12 ENCOUNTER — Encounter: Payer: Self-pay | Admitting: Internal Medicine

## 2023-01-20 ENCOUNTER — Other Ambulatory Visit: Payer: Self-pay | Admitting: Internal Medicine

## 2023-02-07 DIAGNOSIS — E785 Hyperlipidemia, unspecified: Secondary | ICD-10-CM | POA: Diagnosis not present

## 2023-02-07 DIAGNOSIS — I700 Atherosclerosis of aorta: Secondary | ICD-10-CM | POA: Diagnosis not present

## 2023-02-07 DIAGNOSIS — I10 Essential (primary) hypertension: Secondary | ICD-10-CM | POA: Diagnosis not present

## 2023-02-07 DIAGNOSIS — K2100 Gastro-esophageal reflux disease with esophagitis, without bleeding: Secondary | ICD-10-CM | POA: Diagnosis not present

## 2023-02-07 DIAGNOSIS — E039 Hypothyroidism, unspecified: Secondary | ICD-10-CM | POA: Diagnosis not present

## 2023-02-07 DIAGNOSIS — Z0000 Encounter for general adult medical examination without abnormal findings: Secondary | ICD-10-CM | POA: Diagnosis not present

## 2023-02-09 ENCOUNTER — Encounter: Payer: Self-pay | Admitting: Internal Medicine

## 2023-03-11 ENCOUNTER — Telehealth: Payer: Self-pay | Admitting: *Deleted

## 2023-03-11 ENCOUNTER — Ambulatory Visit: Payer: Medicare Other | Admitting: *Deleted

## 2023-03-11 VITALS — Ht 65.0 in | Wt 174.0 lb

## 2023-03-11 DIAGNOSIS — Z86010 Personal history of colonic polyps: Secondary | ICD-10-CM

## 2023-03-11 MED ORDER — NA SULFATE-K SULFATE-MG SULF 17.5-3.13-1.6 GM/177ML PO SOLN: 1.0000 | Freq: Once | ORAL | 0 refills | Status: AC

## 2023-03-11 NOTE — Progress Notes (Signed)
Pt's name and DOB verified at the beginning of the pre-visit.  Pt denies any difficulty with ambulating,sitting, laying down or rolling side to side Gave both [facility] main # and MD on call # prior to instructions.  No egg or soy allergy known to patient  No issues known to pt with past sedation with any surgeries or procedures Pt denies having issues being intubated Pt has no issues moving head neck or swallowing No FH of Malignant Hyperthermia Pt is not on diet pills Pt is not on home 02  Pt is not on blood thinners  Pt denies issues with constipation  Pt is not on dialysis Pt denise any abnormal heart rhythms  Pt denies any upcoming cardiac testing Pt encouraged to use to use Singlecare or Goodrx to reduce cost  Patient's chart reviewed by Cathlyn Parsons CNRA prior to pre-visit and patient appropriate for the [facility].  Pre-visit completed and red dot placed by patient's name on their procedure day (on provider's schedule).  . Visit by phone Pt states weight is 174 lb Instructed pt why it is important to and  to call if they have any changes in health or new medications. Directed them to the # given and on instructions.   Pt states they will.  Instructions reviewed with pt and pt states understanding. Instructed to review again prior to procedure. Pt states they will.  Instructions sent by mail with coupon and by my chart

## 2023-03-11 NOTE — Telephone Encounter (Signed)
Attempt to reach pt for pre-visit. LM with call back #.  Will attempt to reach again in 5 min due to no other # listed in profile

## 2023-03-17 ENCOUNTER — Encounter: Payer: Self-pay | Admitting: Internal Medicine

## 2023-03-29 ENCOUNTER — Encounter: Payer: Medicare Other | Admitting: Internal Medicine

## 2023-03-29 ENCOUNTER — Encounter: Payer: Self-pay | Admitting: Internal Medicine

## 2023-03-29 ENCOUNTER — Ambulatory Visit: Payer: Medicare Other | Admitting: Internal Medicine

## 2023-03-29 VITALS — BP 113/41 | HR 55 | Temp 97.2°F | Resp 12 | Ht 65.0 in | Wt 174.0 lb

## 2023-03-29 DIAGNOSIS — D123 Benign neoplasm of transverse colon: Secondary | ICD-10-CM

## 2023-03-29 DIAGNOSIS — Z860101 Personal history of adenomatous and serrated colon polyps: Secondary | ICD-10-CM | POA: Diagnosis not present

## 2023-03-29 DIAGNOSIS — Z09 Encounter for follow-up examination after completed treatment for conditions other than malignant neoplasm: Secondary | ICD-10-CM | POA: Diagnosis not present

## 2023-03-29 DIAGNOSIS — Z860100 Personal history of colon polyps, unspecified: Secondary | ICD-10-CM

## 2023-03-29 DIAGNOSIS — E785 Hyperlipidemia, unspecified: Secondary | ICD-10-CM | POA: Diagnosis not present

## 2023-03-29 MED ORDER — SODIUM CHLORIDE 0.9 % IV SOLN: 500.0000 mL | Freq: Once | INTRAVENOUS | Status: DC

## 2023-03-29 NOTE — Progress Notes (Signed)
Vitals-SH  Pt's states no medical or surgical changes since previsit or office visit. 

## 2023-03-29 NOTE — Progress Notes (Signed)
HISTORY OF PRESENT ILLNESS:  Jean Henson is a 72 y.o. female who presents today for surveillance colonoscopy.  Previous examinations elsewhere with tubular adenomas and sessile serrated polyps  REVIEW OF SYSTEMS:  All non-GI ROS negative except for  Past Medical History:  Diagnosis Date   Arthritis    Colon polyp    Eosinophilic esophagitis    Gallstones    GERD (gastroesophageal reflux disease)    HLD (hyperlipidemia)    HTN (hypertension)    Hypothyroidism    Pancreatitis    Psoriasis    Status post dilation of esophageal narrowing     Past Surgical History:  Procedure Laterality Date   APPENDECTOMY     CESAREAN SECTION     CHOLECYSTECTOMY     COLOSTOMY     UTERINE CYST REMOVED      Social History Aquilla Shambley  reports that she quit smoking about 42 years ago. Her smoking use included cigarettes. She has never used smokeless tobacco. She reports current alcohol use. She reports that she does not use drugs.  family history includes Bipolar disorder in her father; Heart disease in her father, maternal grandmother, and mother; Hypertension in her brother, brother, sister, and sister; Liver disease in her father; Psoriasis in her maternal grandmother; Rheumatic fever in her maternal grandmother.  No Known Allergies     PHYSICAL EXAMINATION: Vital signs: BP (!) 158/74 (BP Location: Left Arm, Patient Position: Sitting, Cuff Size: Normal)   Pulse (!) 55   Temp (!) 97.2 F (36.2 C) (Temporal)   Resp 15   Ht 5\' 5"  (1.651 m)   Wt 174 lb (78.9 kg)   SpO2 99%   BMI 28.96 kg/m  General: Well-developed, well-nourished, no acute distress HEENT: Sclerae are anicteric, conjunctiva pink. Oral mucosa intact Lungs: Clear Heart: Regular Abdomen: soft, nontender, nondistended, no obvious ascites, no peritoneal signs, normal bowel sounds. No organomegaly. Extremities: No edema Psychiatric: alert and oriented x3. Cooperative     ASSESSMENT:  History of adenomatous and  sessile serrated polyps   PLAN:   Surveillance colonoscopy

## 2023-03-29 NOTE — Progress Notes (Signed)
Sedate, gd SR, tolerated procedure well, VSS, report to RN 

## 2023-03-29 NOTE — Op Note (Signed)
[facility] Patient Name: Jean Henson Procedure Date: 03/29/2023 11:45 AM MRN: [id number] Endoscopist: Wilhemina Bonito. Marina Goodell , MD, [id number] Age: 72 Referring MD:  Date of Birth: Aug 28, 1950 Gender: Female Account #: [id number] Procedure:                Colonoscopy with cold snare x 1 Indications:              High risk colon cancer surveillance: Personal                            history of non-advanced adenoma, High risk colon                            cancer surveillance: Personal history of sessile                            serrated colon polyp (less than 10 mm in size) with                            no dysplasia. Prior elsewhwere 2013, 19 Medicines:                Monitored Anesthesia Care Procedure:                Pre-Anesthesia Assessment:                           - Prior to the procedure, a History and Physical                            was performed, and patient medications and                            allergies were reviewed. The patient's tolerance of                            previous anesthesia was also reviewed. The risks                            and benefits of the procedure and the sedation                            options and risks were discussed with the patient.                            All questions were answered, and informed consent                            was obtained. Prior Anticoagulants: The patient has                            taken no anticoagulant or antiplatelet agents. ASA                            Grade Assessment: II - A patient with mild systemic  disease. After reviewing the risks and benefits,                            the patient was deemed in satisfactory condition to                            undergo the procedure.                           After obtaining informed consent, the colonoscope                            was passed under direct vision. Throughout the                             procedure, the patient's blood pressure, pulse, and                            oxygen saturations were monitored continuously. The                            CF [id number] #[id number] was introduced through the anus                            and advanced to the the cecum, identified by                            appendiceal orifice and ileocecal valve. The                            ileocecal valve, appendiceal orifice, and rectum                            were photographed. The quality of the bowel                            preparation was excellent. The colonoscopy was                            performed without difficulty. The patient tolerated                            the procedure well. The bowel preparation used was                            SUPREP via split dose instruction. Scope In: 11:54:03 AM Scope Out: 12:11:36 PM Scope Withdrawal Time: 0 hours 10 minutes 30 seconds  Total Procedure Duration: 0 hours 17 minutes 33 seconds  Findings:                 A 2 mm polyp was found in the transverse colon. The                            polyp was sessile. The polyp was removed with  a                            cold snare. Resection and retrieval were complete.                           Diverticula were found in the sigmoid colon and                            ascending colon.                           The exam was otherwise without abnormality on                            direct and retroflexion views. Complications:            No immediate complications. Estimated blood loss:                            None. Estimated Blood Loss:     Estimated blood loss: none. Impression:               - One 2 mm polyp in the transverse colon, removed                            with a cold snare. Resected and retrieved.                           - Diverticulosis in the sigmoid colon and in the                            ascending colon.                           - The examination was otherwise normal  on direct                            and retroflexion views. Recommendation:           - Repeat colonoscopy is not recommended for                            surveillance.                           - Patient has a contact number available for                            emergencies. The signs and symptoms of potential                            delayed complications were discussed with the                            patient. Return to normal activities tomorrow.  Written discharge instructions were provided to the                            patient.                           - Resume previous diet.                           - Continue present medications.                           - Await pathology results. Wilhemina Bonito. Marina Goodell, MD 03/29/2023 12:16:00 PM This report has been signed electronically.

## 2023-03-29 NOTE — Patient Instructions (Signed)
Discharge instructions given. Handouts on polyps and Diverticulosis. Resume previous medications. YOU HAD AN ENDOSCOPIC PROCEDURE TODAY AT THE [facility]:   Refer to the procedure report that was given to you for any specific questions about what was found during the examination.  If the procedure report does not answer your questions, please call your gastroenterologist to clarify.  If you requested that your care partner not be given the details of your procedure findings, then the procedure report has been included in a sealed envelope for you to review at your convenience later.  YOU SHOULD EXPECT: Some feelings of bloating in the abdomen. Passage of more gas than usual.  Walking can help get rid of the air that was put into your GI tract during the procedure and reduce the bloating. If you had a lower endoscopy (such as a colonoscopy or flexible sigmoidoscopy) you may notice spotting of blood in your stool or on the toilet paper. If you underwent a bowel prep for your procedure, you may not have a normal bowel movement for a few days.  Please Note:  You might notice some irritation and congestion in your nose or some drainage.  This is from the oxygen used during your procedure.  There is no need for concern and it should clear up in a day or so.  SYMPTOMS TO REPORT IMMEDIATELY:  Following lower endoscopy (colonoscopy or flexible sigmoidoscopy):  Excessive amounts of blood in the stool  Significant tenderness or worsening of abdominal pains  Swelling of the abdomen that is new, acute  Fever of 100F or higher  For urgent or emergent issues, a gastroenterologist can be reached at any hour by calling [phone]. Do not use MyChart messaging for urgent concerns.    DIET:  We do recommend a small meal at first, but then you may proceed to your regular diet.  Drink plenty of fluids but you should avoid alcoholic beverages for 24 hours.  ACTIVITY:  You should plan to take it  easy for the rest of today and you should NOT DRIVE or use heavy machinery until tomorrow (because of the sedation medicines used during the test).    FOLLOW UP: Our staff will call the number listed on your records the next business day following your procedure.  We will call around 7:15- 8:00 am to check on you and address any questions or concerns that you may have regarding the information given to you following your procedure. If we do not reach you, we will leave a message.     If any biopsies were taken you will be contacted by phone or by letter within the next 1-3 weeks.  Please call us at [phone] if you have not heard about the biopsies in 3 weeks.    SIGNATURES/CONFIDENTIALITY: You and/or your care partner have signed paperwork which will be entered into your electronic medical record.  These signatures attest to the fact that that the information above on your After Visit Summary has been reviewed and is understood.  Full responsibility of the confidentiality of this discharge information lies with you and/or your care-partner. 

## 2023-03-29 NOTE — Progress Notes (Signed)
Called to room to assist during endoscopic procedure.  Patient ID and intended procedure confirmed with present staff. Received instructions for my participation in the procedure from the performing physician.  

## 2023-03-30 ENCOUNTER — Telehealth: Payer: Self-pay | Admitting: *Deleted

## 2023-03-30 NOTE — Telephone Encounter (Signed)
No answer on  follow up call. Left message.   

## 2023-03-31 ENCOUNTER — Encounter: Payer: Self-pay | Admitting: Internal Medicine

## 2023-03-31 LAB — SURGICAL PATHOLOGY

## 2023-05-04 DIAGNOSIS — M8588 Other specified disorders of bone density and structure, other site: Secondary | ICD-10-CM | POA: Diagnosis not present

## 2023-05-04 DIAGNOSIS — Z1231 Encounter for screening mammogram for malignant neoplasm of breast: Secondary | ICD-10-CM | POA: Diagnosis not present

## 2023-05-06 DIAGNOSIS — I700 Atherosclerosis of aorta: Secondary | ICD-10-CM | POA: Diagnosis not present

## 2023-05-06 DIAGNOSIS — J069 Acute upper respiratory infection, unspecified: Secondary | ICD-10-CM | POA: Diagnosis not present

## 2023-05-06 DIAGNOSIS — K219 Gastro-esophageal reflux disease without esophagitis: Secondary | ICD-10-CM | POA: Diagnosis not present

## 2023-05-06 DIAGNOSIS — Z03818 Encounter for observation for suspected exposure to other biological agents ruled out: Secondary | ICD-10-CM | POA: Diagnosis not present

## 2023-05-06 DIAGNOSIS — I10 Essential (primary) hypertension: Secondary | ICD-10-CM | POA: Diagnosis not present

## 2023-07-15 DIAGNOSIS — R350 Frequency of micturition: Secondary | ICD-10-CM | POA: Diagnosis not present

## 2023-07-15 DIAGNOSIS — I10 Essential (primary) hypertension: Secondary | ICD-10-CM | POA: Diagnosis not present

## 2023-07-27 DIAGNOSIS — R350 Frequency of micturition: Secondary | ICD-10-CM | POA: Diagnosis not present

## 2023-08-08 DIAGNOSIS — E039 Hypothyroidism, unspecified: Secondary | ICD-10-CM | POA: Diagnosis not present

## 2023-08-08 DIAGNOSIS — I10 Essential (primary) hypertension: Secondary | ICD-10-CM | POA: Diagnosis not present

## 2023-08-08 DIAGNOSIS — K219 Gastro-esophageal reflux disease without esophagitis: Secondary | ICD-10-CM | POA: Diagnosis not present

## 2023-11-10 DIAGNOSIS — L821 Other seborrheic keratosis: Secondary | ICD-10-CM | POA: Diagnosis not present

## 2023-11-10 DIAGNOSIS — L304 Erythema intertrigo: Secondary | ICD-10-CM | POA: Diagnosis not present

## 2023-11-10 DIAGNOSIS — D692 Other nonthrombocytopenic purpura: Secondary | ICD-10-CM | POA: Diagnosis not present

## 2023-11-10 DIAGNOSIS — D1801 Hemangioma of skin and subcutaneous tissue: Secondary | ICD-10-CM | POA: Diagnosis not present

## 2023-11-10 DIAGNOSIS — L918 Other hypertrophic disorders of the skin: Secondary | ICD-10-CM | POA: Diagnosis not present

## 2023-11-10 DIAGNOSIS — L400 Psoriasis vulgaris: Secondary | ICD-10-CM | POA: Diagnosis not present

## 2023-11-10 DIAGNOSIS — D225 Melanocytic nevi of trunk: Secondary | ICD-10-CM | POA: Diagnosis not present

## 2023-11-10 DIAGNOSIS — L6611 Classic lichen planopilaris: Secondary | ICD-10-CM | POA: Diagnosis not present

## 2023-11-15 DIAGNOSIS — H31091 Other chorioretinal scars, right eye: Secondary | ICD-10-CM | POA: Diagnosis not present

## 2023-11-15 DIAGNOSIS — H2513 Age-related nuclear cataract, bilateral: Secondary | ICD-10-CM | POA: Diagnosis not present

## 2023-11-15 DIAGNOSIS — H43813 Vitreous degeneration, bilateral: Secondary | ICD-10-CM | POA: Diagnosis not present

## 2023-11-15 DIAGNOSIS — H35373 Puckering of macula, bilateral: Secondary | ICD-10-CM | POA: Diagnosis not present
